# Patient Record
Sex: Female | Born: 1965 | Race: White | Hispanic: No | Smoking: Heavy tobacco smoker
Health system: Southern US, Community
[De-identification: ages and names within clinical notes are randomized; demographics above are authoritative.]

## PROBLEM LIST (undated history)

## (undated) HISTORY — PX: SPINAL FUSION: SHX223

## (undated) HISTORY — PX: TUBAL LIGATION: SHX77

---

## 2008-10-25 DEATH — deceased

## 2015-03-14 DIAGNOSIS — M5410 Radiculopathy, site unspecified: Secondary | ICD-10-CM | POA: Insufficient documentation

## 2015-03-14 DIAGNOSIS — Z720 Tobacco use: Secondary | ICD-10-CM | POA: Insufficient documentation

## 2015-03-14 DIAGNOSIS — Y9389 Activity, other specified: Secondary | ICD-10-CM | POA: Insufficient documentation

## 2015-03-14 DIAGNOSIS — Y998 Other external cause status: Secondary | ICD-10-CM | POA: Insufficient documentation

## 2015-03-14 DIAGNOSIS — S46001A Unspecified injury of muscle(s) and tendon(s) of the rotator cuff of right shoulder, initial encounter: Secondary | ICD-10-CM | POA: Insufficient documentation

## 2015-03-14 DIAGNOSIS — W01198A Fall on same level from slipping, tripping and stumbling with subsequent striking against other object, initial encounter: Secondary | ICD-10-CM | POA: Insufficient documentation

## 2015-03-14 DIAGNOSIS — Y9289 Other specified places as the place of occurrence of the external cause: Secondary | ICD-10-CM | POA: Insufficient documentation

## 2015-03-15 ENCOUNTER — Emergency Department: Payer: Self-pay

## 2015-03-15 ENCOUNTER — Encounter: Payer: Self-pay | Admitting: Emergency Medicine

## 2015-03-15 ENCOUNTER — Emergency Department
Admission: EM | Admit: 2015-03-15 | Discharge: 2015-03-15 | Disposition: A | Discharge status: Home / Self Care | Payer: Self-pay | Attending: Emergency Medicine | Admitting: Emergency Medicine

## 2015-03-15 DIAGNOSIS — S46001A Unspecified injury of muscle(s) and tendon(s) of the rotator cuff of right shoulder, initial encounter: Secondary | ICD-10-CM

## 2015-03-15 DIAGNOSIS — R202 Paresthesia of skin: Secondary | ICD-10-CM

## 2015-03-15 MED ORDER — KETOROLAC TROMETHAMINE 30 MG/ML IJ SOLN
60.0000 mg | Freq: Once | INTRAMUSCULAR | Status: DC
Start: 2015-03-15 — End: 2015-03-15

## 2015-03-15 MED ORDER — KETOROLAC TROMETHAMINE 60 MG/2ML IM SOLN
60.0000 mg | Freq: Once | INTRAMUSCULAR | Status: AC
  Administered 2015-03-15: 60 mg via INTRAMUSCULAR
  Filled 2015-03-15: qty 2

## 2015-03-15 MED ORDER — GABAPENTIN 300 MG PO CAPS
300.0000 mg | ORAL_CAPSULE | Freq: Once | ORAL | Status: AC
  Administered 2015-03-15: 300 mg via ORAL
  Filled 2015-03-15: qty 1

## 2015-03-15 MED ORDER — DIAZEPAM 2 MG PO TABS
2.0000 mg | ORAL_TABLET | Freq: Once | ORAL | Status: AC
  Administered 2015-03-15: 2 mg via ORAL
  Filled 2015-03-15: qty 1

## 2015-03-15 MED ORDER — NAPROXEN 500 MG PO TABS
500.0000 mg | ORAL_TABLET | Freq: Two times a day (BID) | ORAL | Status: AC
Start: 2015-03-15 — End: 2016-03-14

## 2015-03-15 MED ORDER — OXYCODONE-ACETAMINOPHEN 5-325 MG PO TABS: 1.0000 | ORAL_TABLET | Freq: Once | ORAL | Status: DC

## 2015-03-15 MED ORDER — CYCLOBENZAPRINE HCL 10 MG PO TABS: 10.0000 mg | ORAL_TABLET | Freq: Three times a day (TID) | ORAL | Status: AC | PRN

## 2015-03-15 MED ORDER — OXYCODONE-ACETAMINOPHEN 5-325 MG PO TABS
1.0000 | ORAL_TABLET | Freq: Once | ORAL | Status: AC
Start: 2015-03-15 — End: 2015-03-15
  Administered 2015-03-15: 1 via ORAL
  Filled 2015-03-15: qty 1

## 2015-03-15 NOTE — ED Notes (Signed)
Patient with no complaints at this time. Respirations even and unlabored. Skin warm/dry. Discharge instructions reviewed with patient at this time. Patient given opportunity to voice concerns/ask questions. Patient discharged at this time and left Emergency Department with steady gait.   

## 2015-03-15 NOTE — ED Notes (Signed)
Pt arrived to the ED for complaint of neck and right arm pain. Pt states that earlier today she almost fell from standing and was able to prevent the fall by holding her body weigh with her right arm. Hours later the Pt felt like something snapped and began to experience pain and numbness on the right arm. Pt's sensation and range of motion are decreased on affected arm with good capillary refill. Pt is AOx4 in mild pain.

## 2015-03-15 NOTE — ED Provider Notes (Signed)
[facility] Emergency Department Provider Note  ____________________________________________  Time seen: Approximately 110 AM  I have reviewed the triage vital signs and the nursing notes.   HISTORY  Chief Complaint Neck Pain and Arm Pain    HPI Jessica Hays is a 49 y.o. female with a history of a cervical spinal fusion who presents today with right arm pain. She said that she fell at [employer] this afternoon. She said she slipped on the floor and caught herself with her right arm. She said she felt a popping pop in her right shoulder is been having pain ever since. Has tried ibuprofen as well as a icy hot patch to the area of maximum pain with only minimal relief. She says that she is having extreme pain with movement of the right arm from the shoulder all the way down to the wrist and is now having swelling in the extremity. She says that she is also having a tingling and burning sensation down the right arm. She denies any neck pain at this time.   History reviewed. No pertinent past medical history.  There are no active problems to display for this patient.   Past Surgical History  Procedure Laterality Date  . Spinal fusion    . Tubal ligation      Current Outpatient Rx  Name  Route  Sig  Dispense  Refill  . ibuprofen (ADVIL,MOTRIN) 200 MG tablet   Oral   Take 600 mg by mouth every 6 (six) hours as needed.           Allergies Compazine  No family history on file.  Social History Social History  Substance Use Topics  . Smoking status: Heavy Tobacco Smoker    Types: Cigarettes  . Smokeless tobacco: None  . Alcohol Use: Yes    Review of Systems Constitutional: No fever/chills Eyes: No visual changes. ENT: No sore throat. Cardiovascular: Denies chest pain. Respiratory: Denies shortness of breath. Gastrointestinal: No abdominal pain.  No nausea, no vomiting.  No diarrhea.  No constipation. Genitourinary: Negative for  dysuria. Musculoskeletal: Negative for back pain. Skin: Negative for rash. Neurological: Negative for headaches, focal weakness or numbness.  10-point ROS otherwise negative.  ____________________________________________   PHYSICAL EXAM:  VITAL SIGNS: ED Triage Vitals  Enc Vitals Group     BP 03/14/15 2343 157/95 mmHg     Pulse --      Resp 03/14/15 2343 18     Temp 03/14/15 2343 98 F (36.7 C)     Temp Source 03/14/15 2343 Oral     SpO2 03/14/15 2343 100 %     Weight 03/14/15 2343 115 lb (52.164 kg)     Height 03/14/15 2343 5' (1.524 m)     Head Cir --      Peak Flow --      Pain Score 03/14/15 2343 10     Pain Loc --      Pain Edu? --      Excl. in GC? --     Constitutional: Alert and oriented. Well appearing and in no acute distress. Eyes: Conjunctivae are normal. PERRL. EOMI. Head: Atraumatic. Nose: No congestion/rhinnorhea. Mouth/Throat: Mucous membranes are moist.  Oropharynx non-erythematous. Neck: No stridor.   Cardiovascular: Normal rate, regular rhythm. Grossly normal heart sounds.  Good peripheral circulation. Respiratory: Normal respiratory effort.  No retractions. Lungs CTAB. Gastrointestinal: Soft and nontender. No distention. No abdominal bruits. No CVA tenderness. Musculoskeletal: No lower extremity tenderness nor edema.  No joint effusions. Right trapezius about 4 inches medial to the right shoulder with mass which is tender to palpation. Patient says that this is the area of maximum pain. No tenderness over the C-spine. Able to range neck fully. Full sensation to light touch the right upper extremity. Radial pulses are equal and intact bilaterally. Severely restricted range of motion in the right shoulder as well as elbow and wrist secondary to pain. No right upper extremity edema. Neurologic:  Normal speech and language. No gross focal neurologic deficits are appreciated. No gait instability. Skin:  Skin is warm, dry and intact. No rash  noted. Psychiatric: Mood and affect are normal. Speech and behavior are normal.  ____________________________________________   LABS (all labs ordered are listed, but only abnormal results are displayed)  Labs Reviewed - No data to display ____________________________________________  EKG   ____________________________________________  RADIOLOGY  Negative x-ray of the right shoulder. ____________________________________________   PROCEDURES    ____________________________________________   INITIAL IMPRESSION / ASSESSMENT AND PLAN / ED COURSE  Pertinent labs & imaging results that were available during my care of the patient were reviewed by me and considered in my medical decision making (see chart for details).  ----------------------------------------- 3:53 AM on 03/15/2015 -----------------------------------------  Patient now with decreased pain and improved range of motion of the shoulder and the entire right upper extremity. We'll discharge with Flexeril as well as Naprosyn. Likely muscle tear with peripheral nerve injury. Will give follow-up with orthopedics. We'll discharge and shoulder sling. ____________________________________________   FINAL CLINICAL IMPRESSION(S) / ED DIAGNOSES  Acute rotator cuff injury with radiculopathy. Initial visit.    Myrna Blazer, MD 03/15/15 [phone]

## 2017-01-01 IMAGING — CR DG SHOULDER 2+V*R*
1 series · 3 of 3 positions shown · non-contrast
Comparison: None.

CLINICAL DATA: Right shoulder pain after fall.  Initial encounter.

EXAM:
RIGHT SHOULDER - 2+ VIEW

[Series 1: dg shoulder right · 0.14mm/px · 3 of 3 slices shown]
[im 1/3]
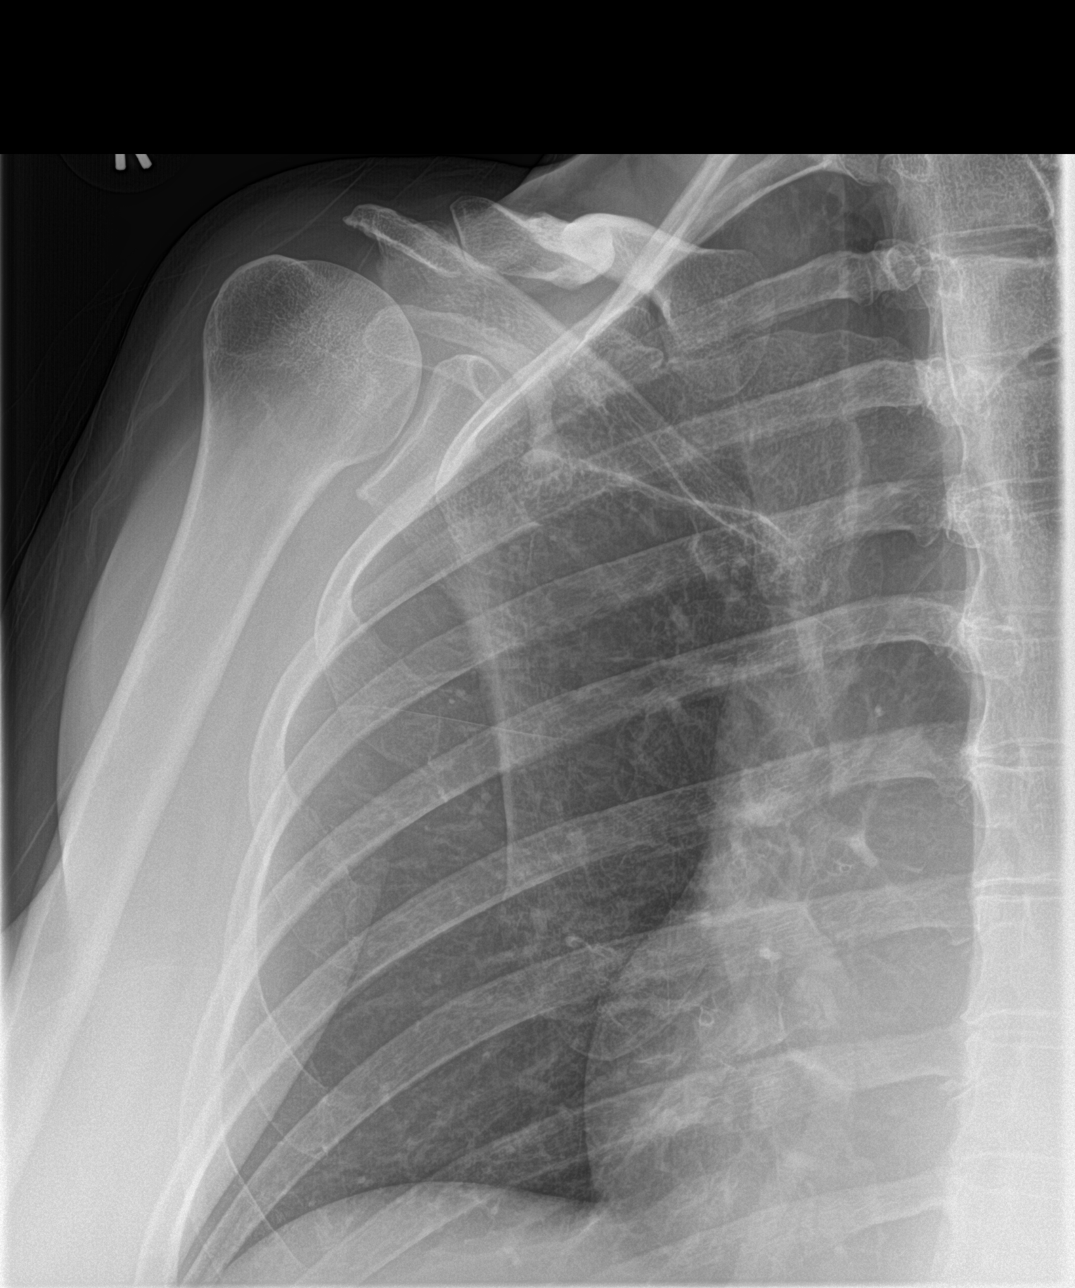
[im 2/3]
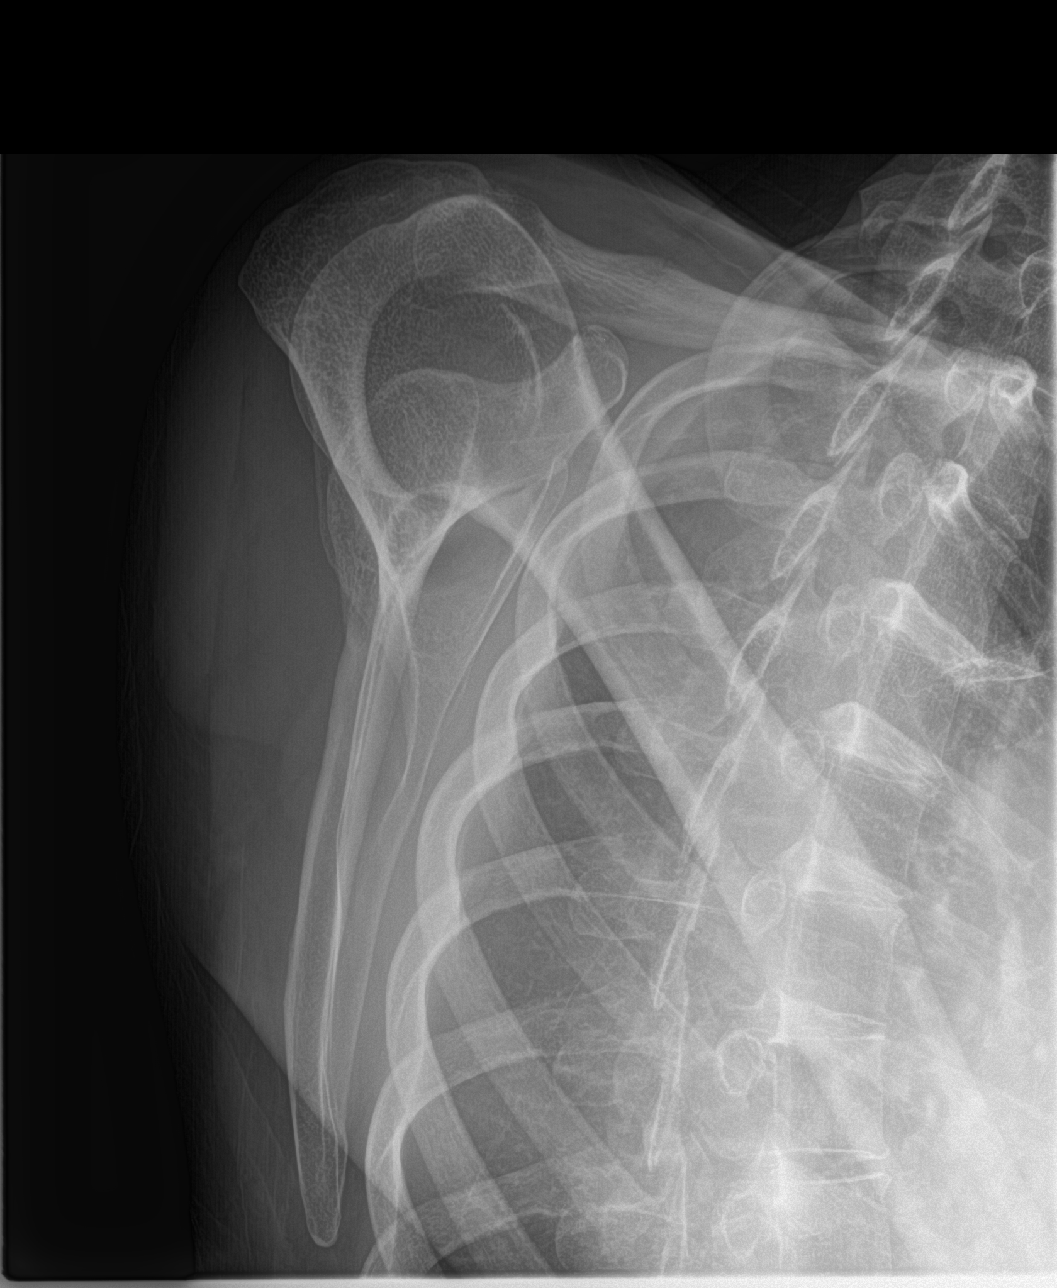
[im 3/3]
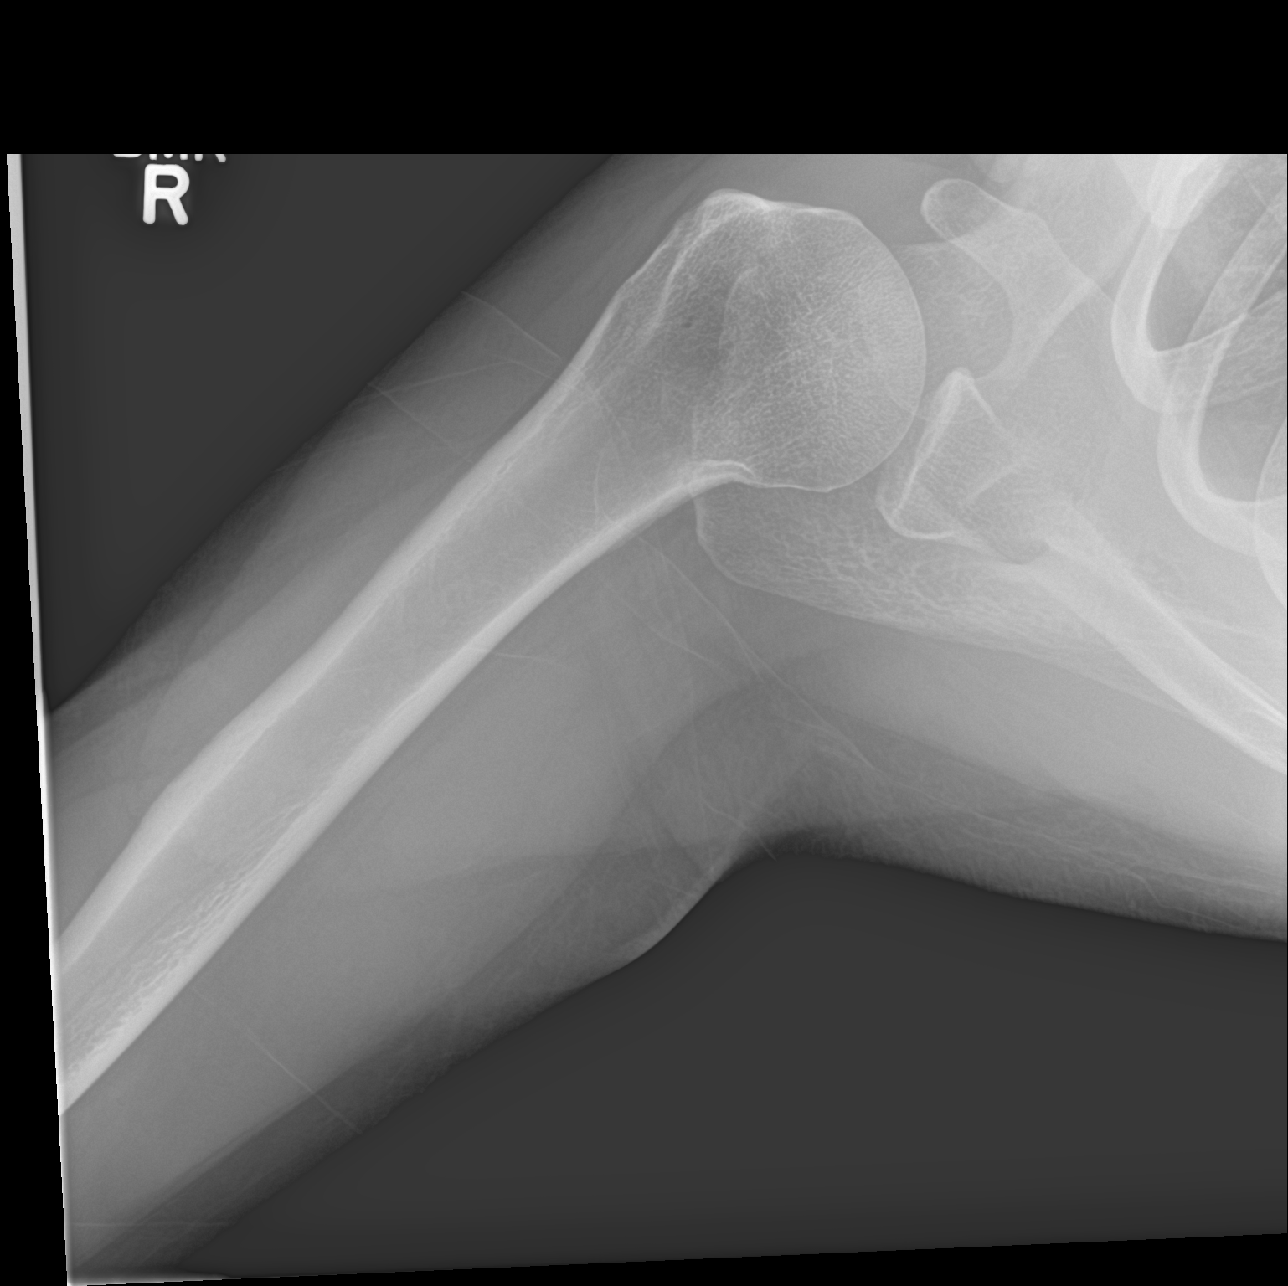

[3 of 3 positions shown; findings below may reference images not displayed]

FINDINGS: There is no evidence of fracture or dislocation. There is no
evidence of arthropathy or other focal bone abnormality. Soft
tissues are unremarkable.
IMPRESSION: Negative.
